# Patient Record
Sex: Male | Born: 1995 | Race: White | Hispanic: No | Marital: Single | Smoking: Never smoker
Health system: Southern US, Community
[De-identification: ages and names within clinical notes are randomized; demographics above are authoritative.]

## PROBLEM LIST (undated history)

## (undated) DIAGNOSIS — K219 Gastro-esophageal reflux disease without esophagitis: Secondary | ICD-10-CM

## (undated) HISTORY — PX: HERNIA REPAIR: SHX51

---
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
## 2017-03-29 ENCOUNTER — Other Ambulatory Visit: Payer: Self-pay | Admitting: Internal Medicine

## 2017-03-29 ENCOUNTER — Ambulatory Visit
Admission: RE | Admit: 2017-03-29 | Discharge: 2017-03-29 | Disposition: A | Discharge status: Home / Self Care | Payer: No Typology Code available for payment source | Source: Ambulatory Visit | Attending: Internal Medicine | Admitting: Internal Medicine

## 2017-03-29 DIAGNOSIS — Z111 Encounter for screening for respiratory tuberculosis: Secondary | ICD-10-CM

## 2018-05-28 ENCOUNTER — Other Ambulatory Visit: Payer: Self-pay

## 2018-05-28 ENCOUNTER — Encounter (HOSPITAL_COMMUNITY): Payer: Self-pay | Admitting: Emergency Medicine

## 2018-05-28 ENCOUNTER — Emergency Department (HOSPITAL_COMMUNITY)
Admission: EM | Admit: 2018-05-28 | Discharge: 2018-05-28 | Disposition: A | Discharge status: Home / Self Care | Payer: BLUE CROSS/BLUE SHIELD | Attending: Emergency Medicine | Admitting: Emergency Medicine

## 2018-05-28 ENCOUNTER — Emergency Department (HOSPITAL_COMMUNITY): Payer: BLUE CROSS/BLUE SHIELD

## 2018-05-28 DIAGNOSIS — R109 Unspecified abdominal pain: Secondary | ICD-10-CM

## 2018-05-28 DIAGNOSIS — R197 Diarrhea, unspecified: Secondary | ICD-10-CM | POA: Diagnosis not present

## 2018-05-28 DIAGNOSIS — R112 Nausea with vomiting, unspecified: Secondary | ICD-10-CM | POA: Insufficient documentation

## 2018-05-28 DIAGNOSIS — R1011 Right upper quadrant pain: Secondary | ICD-10-CM | POA: Insufficient documentation

## 2018-05-28 LAB — CBC WITH DIFFERENTIAL/PLATELET
BASOS ABS: 0.1 10*3/uL (ref 0.0–0.1)
Basophils Relative: 1 %
Eosinophils Absolute: 0.2 10*3/uL (ref 0.0–0.7)
Eosinophils Relative: 2 %
HEMATOCRIT: 46.2 % (ref 39.0–52.0)
HEMOGLOBIN: 15.6 g/dL (ref 13.0–17.0)
LYMPHS PCT: 24 %
Lymphs Abs: 2 10*3/uL (ref 0.7–4.0)
MCH: 29.9 pg (ref 26.0–34.0)
MCHC: 33.8 g/dL (ref 30.0–36.0)
MCV: 88.7 fL (ref 78.0–100.0)
MONO ABS: 0.9 10*3/uL (ref 0.1–1.0)
MONOS PCT: 11 %
NEUTROS ABS: 5 10*3/uL (ref 1.7–7.7)
NEUTROS PCT: 62 %
Platelets: 211 10*3/uL (ref 150–400)
RBC: 5.21 MIL/uL (ref 4.22–5.81)
RDW: 12.6 % (ref 11.5–15.5)
WBC: 8.1 10*3/uL (ref 4.0–10.5)

## 2018-05-28 LAB — COMPREHENSIVE METABOLIC PANEL
ALK PHOS: 43 U/L (ref 38–126)
ALT: 13 U/L (ref 0–44)
AST: 18 U/L (ref 15–41)
Albumin: 4.8 g/dL (ref 3.5–5.0)
Anion gap: 8 (ref 5–15)
BILIRUBIN TOTAL: 1.3 mg/dL — AB (ref 0.3–1.2)
BUN: 9 mg/dL (ref 6–20)
CALCIUM: 9.3 mg/dL (ref 8.9–10.3)
CO2: 26 mmol/L (ref 22–32)
CREATININE: 0.79 mg/dL (ref 0.61–1.24)
Chloride: 105 mmol/L (ref 98–111)
GFR calc Af Amer: >60 mL/min (ref >60)
Glucose, Bld: 90 mg/dL (ref 70–99)
Potassium: 4.3 mmol/L (ref 3.5–5.1)
Sodium: 139 mmol/L (ref 135–145)
TOTAL PROTEIN: 8 g/dL (ref 6.5–8.1)

## 2018-05-28 LAB — URINALYSIS, ROUTINE W REFLEX MICROSCOPIC
Bacteria, UA: NONE SEEN
Bilirubin Urine: NEGATIVE
GLUCOSE, UA: NEGATIVE mg/dL
Hgb urine dipstick: NEGATIVE
Ketones, ur: NEGATIVE mg/dL
Leukocytes, UA: NEGATIVE
Nitrite: NEGATIVE
PH: 6 (ref 5.0–8.0)
Protein, ur: 30 mg/dL — AB
Specific Gravity, Urine: 1.027 (ref 1.005–1.030)

## 2018-05-28 LAB — LIPASE, BLOOD: Lipase: 41 U/L (ref 11–51)

## 2018-05-28 MED ORDER — MORPHINE SULFATE (PF) 2 MG/ML IV SOLN
2.0000 mg | INTRAVENOUS | Status: DC | PRN
  Administered 2018-05-28: 2 mg via INTRAVENOUS
  Filled 2018-05-28: qty 1

## 2018-05-28 MED ORDER — ONDANSETRON 4 MG PO TBDP: 4.0000 mg | ORAL_TABLET | Freq: Three times a day (TID) | ORAL | 0 refills | Status: AC | PRN

## 2018-05-28 MED ORDER — ONDANSETRON HCL 4 MG/2ML IJ SOLN
4.0000 mg | INTRAMUSCULAR | Status: DC | PRN
  Administered 2018-05-28: 4 mg via INTRAVENOUS
  Filled 2018-05-28: qty 2

## 2018-05-28 MED ORDER — FAMOTIDINE 20 MG PO TABS: 20.0000 mg | ORAL_TABLET | Freq: Every day | ORAL | 0 refills | Status: AC

## 2018-05-28 MED ORDER — SODIUM CHLORIDE 0.9 % IV BOLUS
1000.0000 mL | Freq: Once | INTRAVENOUS | Status: AC
  Administered 2018-05-28: 1000 mL via INTRAVENOUS

## 2018-05-28 MED ORDER — IOPAMIDOL (ISOVUE-300) INJECTION 61%
100.0000 mL | Freq: Once | INTRAVENOUS | Status: AC | PRN
  Administered 2018-05-28: 100 mL via INTRAVENOUS

## 2018-05-28 NOTE — ED Triage Notes (Signed)
Patient reports loss of appetite x1 week and right upper abd/flank  pain that started this morning. Per patient nausea, vomiting, and diarrhea. Unsure of any fevers and denies any urinary symptoms.

## 2018-05-28 NOTE — ED Provider Notes (Signed)
[facility] EMERGENCY DEPARTMENT Provider Note   CSN: [id number] Arrival date & time: 05/28/18  1238     History   Chief Complaint Chief Complaint  Patient presents with  . Abdominal Pain    HPI Dan Hopkins is a 22 y.o. male.  HPI Pt was seen at 1445.  Per pt, c/o gradual onset and persistence of waxing and waning RUQ abd "pain" for the past several days.  Has been associated with decreased appetite, nausea, dry heaves, and a few episodes of diarrhea.  Describes the abd pain as "sharp" and "comes in waves."  States his symptoms began "after I was outside and got hot." Denies fevers, no back pain, no rash, no CP/palpitations, no cough/SOB, no black or blood in stools or emesis.      Past Medical History:  Diagnosis Date  . GERD (gastroesophageal reflux disease)     There are no active problems to display for this patient.   Past Surgical History:  Procedure Laterality Date  . HERNIA REPAIR          Home Medications    Prior to Admission medications   Not on File    Family History History reviewed. No pertinent family history.  Social History Social History   Tobacco Use  . Smoking status: Never Smoker  . Smokeless tobacco: Never Used  Substance Use Topics  . Alcohol use: No  . Drug use: Yes    Types: Marijuana     Allergies   Patient has no known allergies.   Review of Systems Review of Systems ROS: Statement: All systems negative except as marked or noted in the HPI; Constitutional: Negative for fever and chills. ; ; Eyes: Negative for eye pain, redness and discharge. ; ; ENMT: Negative for ear pain, hoarseness, nasal congestion, sinus pressure and sore throat. ; ; Cardiovascular: Negative for chest pain, palpitations, diaphoresis, dyspnea and peripheral edema. ; ; Respiratory: Negative for cough, wheezing and stridor. ; ; Gastrointestinal: +N/V/D, abd pain. Negative for blood in stool, hematemesis, jaundice and rectal bleeding. . ; ;  Genitourinary: Negative for dysuria, flank pain and hematuria. ; ; Musculoskeletal: Negative for back pain and neck pain. Negative for swelling and trauma.; ; Skin: Negative for pruritus, rash, abrasions, blisters, bruising and skin lesion.; ; Neuro: Negative for headache, lightheadedness and neck stiffness. Negative for weakness, altered level of consciousness, altered mental status, extremity weakness, paresthesias, involuntary movement, seizure and syncope.       Physical Exam Updated Vital Signs BP 110/61 (BP Location: Left Arm)   Pulse (!) 48   Temp 98.5 F (36.9 C) (Oral)   Resp 16   Ht 6\' 1"  (1.854 m)   Wt 72.1 kg (159 lb)   SpO2 100%   BMI 20.98 kg/m   Physical Exam 1450: Physical examination:  Nursing notes reviewed; Vital signs and O2 SAT reviewed;  Constitutional: Well developed, Well nourished, Well hydrated, In no acute distress; Head:  Normocephalic, atraumatic; Eyes: EOMI, PERRL, No scleral icterus; ENMT: Mouth and pharynx normal, Mucous membranes moist; Neck: Supple, Full range of motion, No lymphadenopathy; Cardiovascular: Regular rate and rhythm, No gallop; Respiratory: Breath sounds clear & equal bilaterally, No wheezes.  Speaking full sentences with ease, Normal respiratory effort/excursion; Chest: Nontender, Movement normal; Abdomen: Soft, +RUQ and mid-epigastric areas tender to palp. No rebound or guarding. Nondistended, Normal bowel sounds; Genitourinary: No CVA tenderness; Extremities: Peripheral pulses normal, No tenderness, No edema, No calf edema or asymmetry.; Neuro: AA&Ox3, Major CN grossly intact.  Speech clear. No gross focal motor or sensory deficits in extremities.; Skin: Color normal, Warm, Dry.   ED Treatments / Results  Labs (all labs ordered are listed, but only abnormal results are displayed)   EKG None  Radiology   Procedures Procedures (including critical care time)  Medications Ordered in ED Medications  sodium chloride 0.9 % bolus 1,000  mL (1,000 mLs Intravenous New Bag/Given 05/28/18 1517)  ondansetron (ZOFRAN) injection 4 mg (4 mg Intravenous Given 05/28/18 1513)  morphine 2 MG/ML injection 2 mg (2 mg Intravenous Given 05/28/18 1513)  iopamidol (ISOVUE-300) 61 % injection 100 mL (100 mLs Intravenous Contrast Given 05/28/18 1521)     Initial Impression / Assessment and Plan / ED Course  I have reviewed the triage vital signs and the nursing notes.  Pertinent labs & imaging results that were available during my care of the patient were reviewed by me and considered in my medical decision making (see chart for details).  MDM Reviewed: previous chart, nursing note and vitals Reviewed previous: labs Interpretation: labs, x-ray and CT scan   Results for orders placed or performed during the hospital encounter of 05/28/18  Lipase, blood  Result Value Ref Range   Lipase 41 11 - 51 U/L  Comprehensive metabolic panel  Result Value Ref Range   Sodium 139 135 - 145 mmol/L   Potassium 4.3 3.5 - 5.1 mmol/L   Chloride 105 98 - 111 mmol/L   CO2 26 22 - 32 mmol/L   Glucose, Bld 90 70 - 99 mg/dL   BUN 9 6 - 20 mg/dL   Creatinine, Ser 4.09 0.61 - 1.24 mg/dL   Calcium 9.3 8.9 - 81.1 mg/dL   Total Protein 8.0 6.5 - 8.1 g/dL   Albumin 4.8 3.5 - 5.0 g/dL   AST 18 15 - 41 U/L   ALT 13 0 - 44 U/L   Alkaline Phosphatase 43 38 - 126 U/L   Total Bilirubin 1.3 (H) 0.3 - 1.2 mg/dL   GFR calc non Af Amer >60 >60 mL/min   GFR calc Af Amer >60 >60 mL/min   Anion gap 8 5 - 15  Urinalysis, Routine w reflex microscopic  Result Value Ref Range   Color, Urine YELLOW YELLOW   APPearance CLEAR CLEAR   Specific Gravity, Urine 1.027 1.005 - 1.030   pH 6.0 5.0 - 8.0   Glucose, UA NEGATIVE NEGATIVE mg/dL   Hgb urine dipstick NEGATIVE NEGATIVE   Bilirubin Urine NEGATIVE NEGATIVE   Ketones, ur NEGATIVE NEGATIVE mg/dL   Protein, ur 30 (A) NEGATIVE mg/dL   Nitrite NEGATIVE NEGATIVE   Leukocytes, UA NEGATIVE NEGATIVE   RBC / HPF 0-5 0 - 5  RBC/hpf   WBC, UA 0-5 0 - 5 WBC/hpf   Bacteria, UA NONE SEEN NONE SEEN   Squamous Epithelial / LPF 0-5 0 - 5   Mucus PRESENT   CBC with Differential/Platelet  Result Value Ref Range   WBC 8.1 4.0 - 10.5 K/uL   RBC 5.21 4.22 - 5.81 MIL/uL   Hemoglobin 15.6 13.0 - 17.0 g/dL   HCT 91.4 78.2 - 95.6 %   MCV 88.7 78.0 - 100.0 fL   MCH 29.9 26.0 - 34.0 pg   MCHC 33.8 30.0 - 36.0 g/dL   RDW 21.3 08.6 - 57.8 %   Platelets 211 150 - 400 K/uL   Neutrophils Relative % 62 %   Neutro Abs 5.0 1.7 - 7.7 K/uL   Lymphocytes Relative 24 %  Lymphs Abs 2.0 0.7 - 4.0 K/uL   Monocytes Relative 11 %   Monocytes Absolute 0.9 0.1 - 1.0 K/uL   Eosinophils Relative 2 %   Eosinophils Absolute 0.2 0.0 - 0.7 K/uL   Basophils Relative 1 %   Basophils Absolute 0.1 0.0 - 0.1 K/uL   Dg Chest 2 View Result Date: 05/28/2018 CLINICAL DATA:  Patient with loss of appetite.  Flank pain. EXAM: CHEST - 2 VIEW COMPARISON:  Chest radiograph 03/29/2017 FINDINGS: Stable cardiac and mediastinal contours. No consolidative pulmonary opacities. No pleural effusion or pneumothorax. Regional skeleton is unremarkable. IMPRESSION: No acute cardiopulmonary process. Electronically Signed   By: Annia Beltrew  Davis M.D.   On: 05/28/2018 15:54   Ct Abdomen Pelvis W Contrast Result Date: 05/28/2018 CLINICAL DATA:  Acute abdominal pain.  Right-sided pain EXAM: CT ABDOMEN AND PELVIS WITH CONTRAST TECHNIQUE: Multidetector CT imaging of the abdomen and pelvis was performed using the standard protocol following bolus administration of intravenous contrast. CONTRAST:  100mL ISOVUE-300 IOPAMIDOL (ISOVUE-300) INJECTION 61% COMPARISON:  01/24/2016 FINDINGS: Lower chest:  No contributory findings. Hepatobiliary: No focal liver abnormality.No evidence of biliary obstruction or stone. Pancreas: Unremarkable. Spleen: Unremarkable. Adrenals/Urinary Tract: Negative adrenals. No hydronephrosis or stone. Narrowing of the left renal vein as it crosses under the SMA,  incidental in this setting. Unremarkable bladder. Stomach/Bowel:  No obstruction. No appendicitis. Vascular/Lymphatic: No acute vascular abnormality. No mass or adenopathy. Reproductive:No pathologic findings. Other: No ascites or pneumoperitoneum. Musculoskeletal: No acute abnormalities. IMPRESSION: No explanation for abdominal pain. Electronically Signed   By: Marnee SpringJonathon  Watts M.D.   On: 05/28/2018 15:56    1645:  Epic chart reviewed: pt with chronically elevated Tbili. Feels better after meds and wants to go home now. Pt has tol PO well while in the ED without N/V.  No stooling while in the ED. VSS. Tx symptomatically at this time. Dx and testing d/w pt and family.  Questions answered.  Verb understanding, agreeable to d/c home with outpt f/u.   Final Clinical Impressions(s) / ED Diagnoses   Final diagnoses:  None    ED Discharge Orders    None       Samuel JesterMcManus, Tajha Sammarco, DO 06/01/18 1435

## 2018-05-28 NOTE — ED Notes (Signed)
Pt reports going to [employer] Fri and got hot  awakened last pm with R rib pain   Reports N

## 2018-05-28 NOTE — ED Notes (Signed)
To CT

## 2018-05-28 NOTE — Discharge Instructions (Signed)
Take the prescriptions as directed.  Increase your fluid intake (ie:  Gatoraide) for the next few days, as discussed.  Eat a bland diet and advance to your regular diet slowly as you can tolerate it.   Avoid full strength juices, as well as milk and milk products until your diarrhea has resolved. Eat a bland diet, avoiding greasy, fatty, fried foods, as well as spicy and acidic foods or beverages.  Avoid eating within 2 to 3 hours before going to bed or laying down.  Also avoid teas, colas, coffee, chocolate, pepermint and spearment. Call your regular medical doctor tomorrow to schedule a follow up appointment this week.  Return to the Emergency Department immediately if worsening.

## 2019-06-11 ENCOUNTER — Other Ambulatory Visit: Payer: Self-pay

## 2019-06-11 ENCOUNTER — Other Ambulatory Visit: Payer: BLUE CROSS/BLUE SHIELD

## 2019-06-11 DIAGNOSIS — Z20822 Contact with and (suspected) exposure to covid-19: Secondary | ICD-10-CM

## 2019-06-15 LAB — NOVEL CORONAVIRUS, NAA: SARS-CoV-2, NAA: NOT DETECTED

## 2019-09-11 IMAGING — CT CT ABD-PELV W/ CM
2 of 4 series · 17 of 46 positions shown, 19 images · IV contrast (Isovue)
Comparison: 01/24/2016

CLINICAL DATA: Acute abdominal pain.  Right-sided pain

EXAM:
CT ABDOMEN AND PELVIS WITH CONTRAST
TECHNIQUE: Multidetector CT imaging of the abdomen and pelvis was performed
using the standard protocol following bolus administration of
intravenous contrast.
CONTRAST:  100mL [id number] IOPAMIDOL ([id number]) INJECTION 61%

[Series 2: axial st · axial · 0.71mm/px · z∈[+1425,+1830]mm · 14 of 89 slices shown, 16 images]
[im 4/89  soft-tissue]
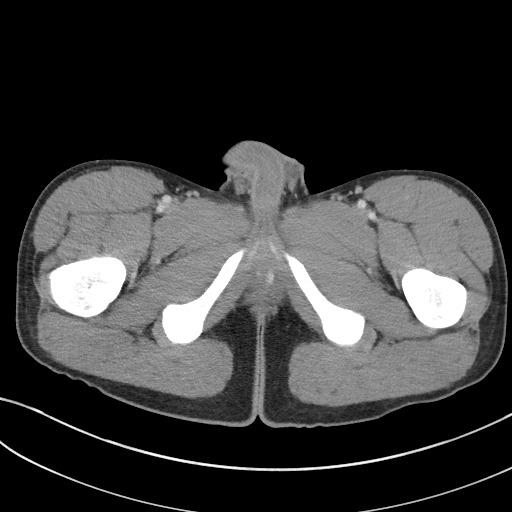
[im 4/89  bone]
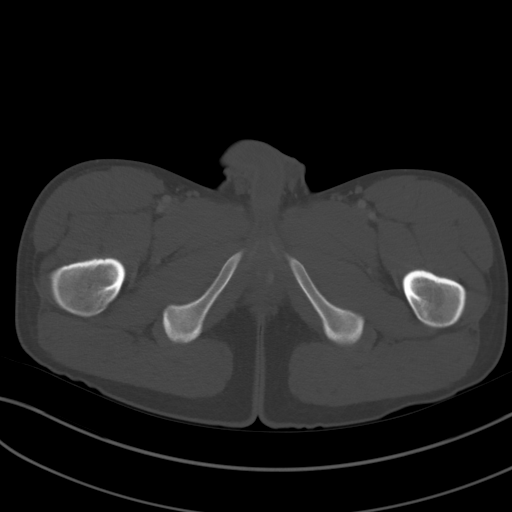
[im 12/89  soft-tissue]
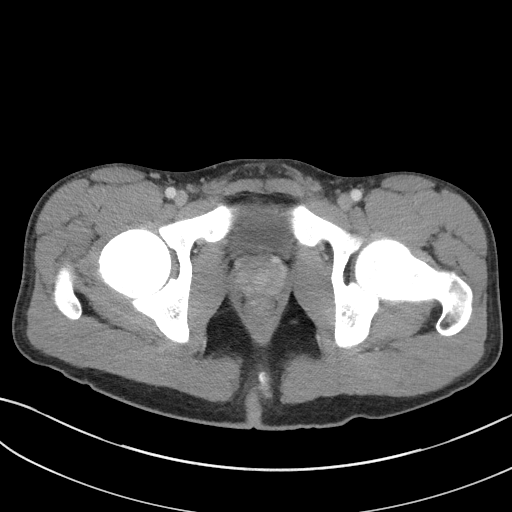
[im 19/89  soft-tissue]
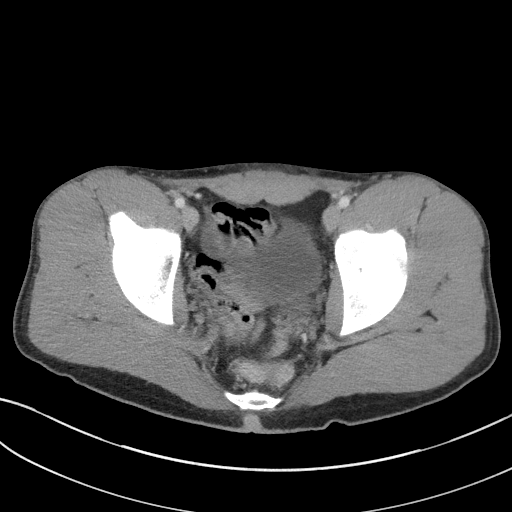
[im 23/89  soft-tissue]
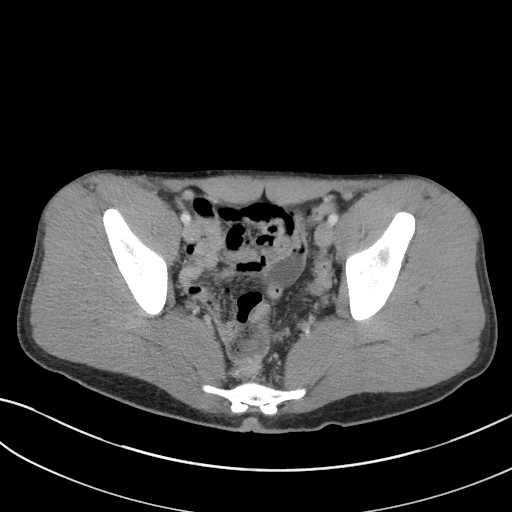
[im 30/89  soft-tissue]
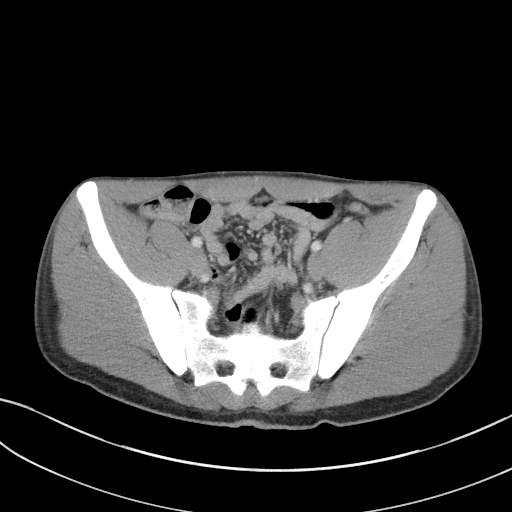
[im 37/89  soft-tissue]
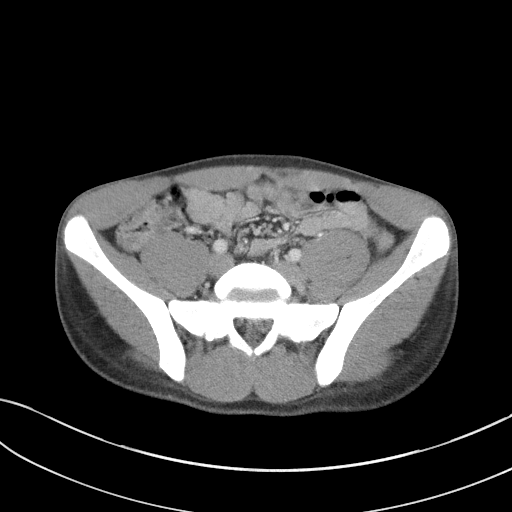
[im 41/89  soft-tissue]
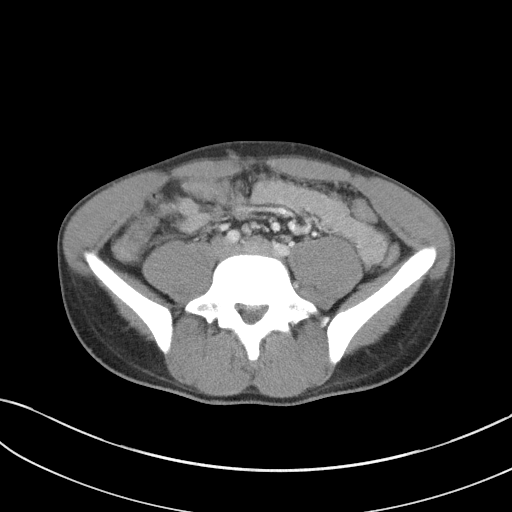
[im 48/89  soft-tissue]
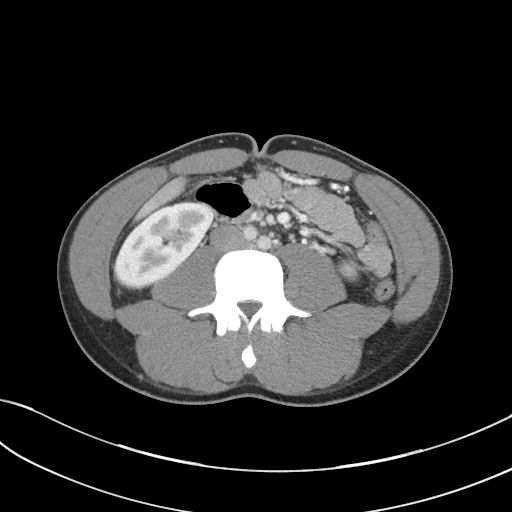
[im 52/89  soft-tissue]
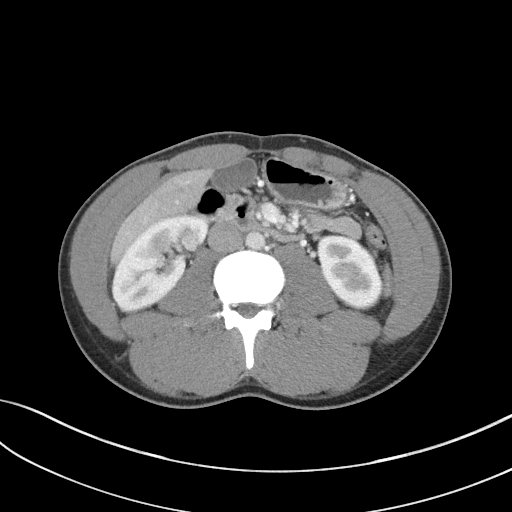
[im 52/89  bone]
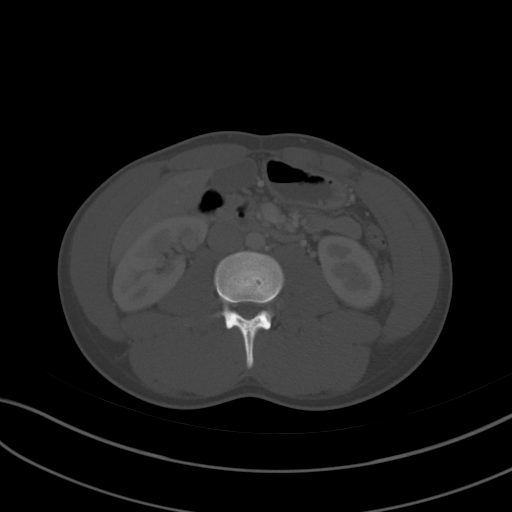
[im 59/89  soft-tissue]
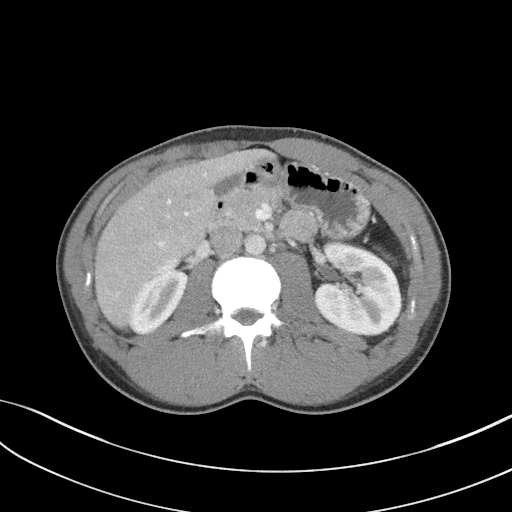
[im 67/89  soft-tissue]
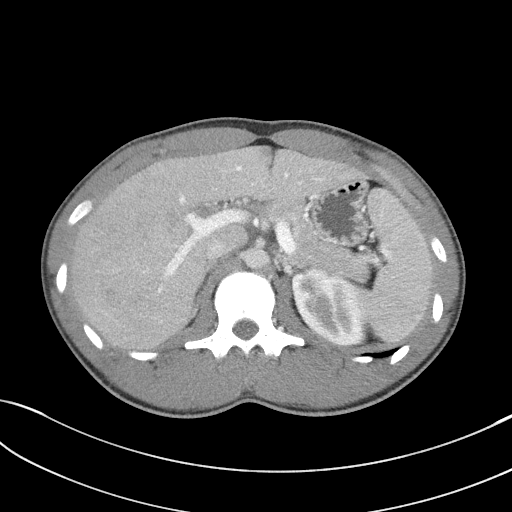
[im 70/89  soft-tissue]
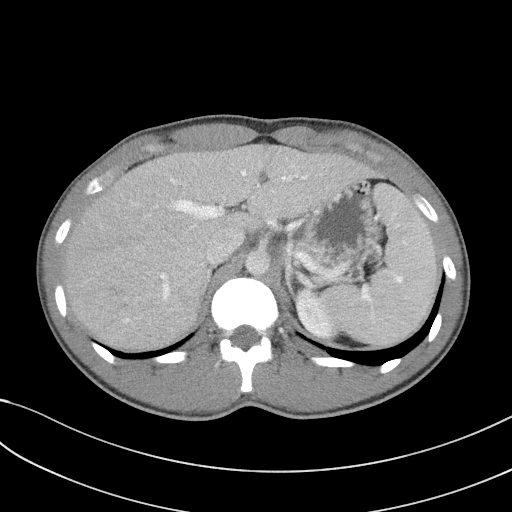
[im 78/89  soft-tissue]
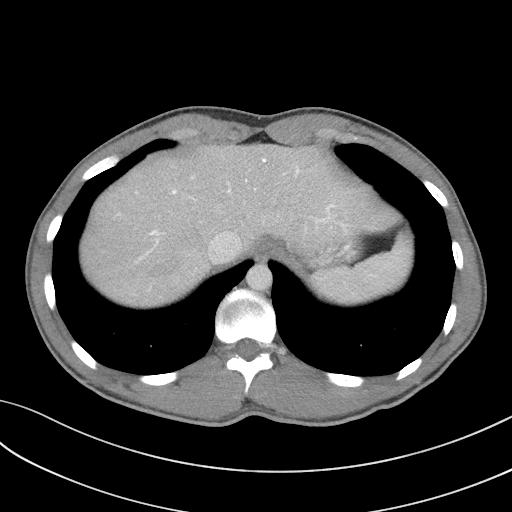
[im 85/89  soft-tissue]
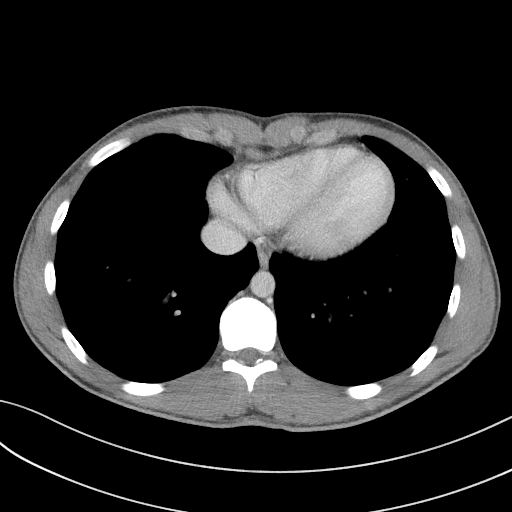

[Series 5: coronal st · coronal · 0.72mm/px · 3 of 72 slices shown]
[im 24/72  soft-tissue]
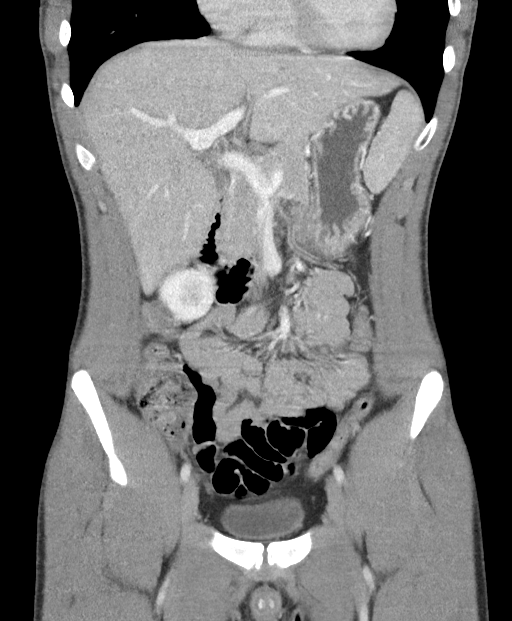
[im 32/72  soft-tissue]
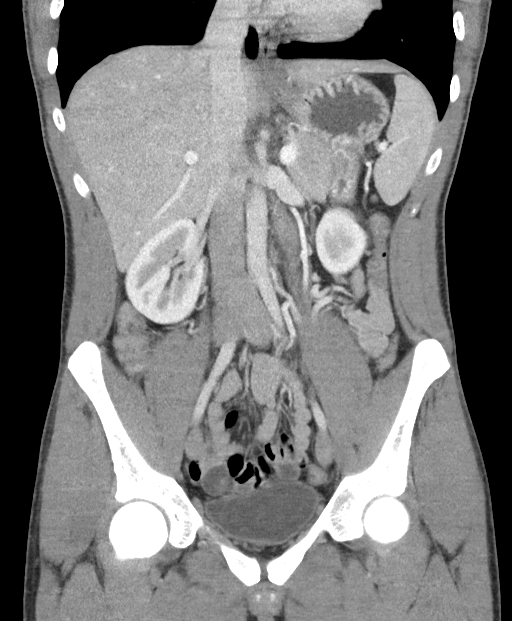
[im 40/72  soft-tissue]
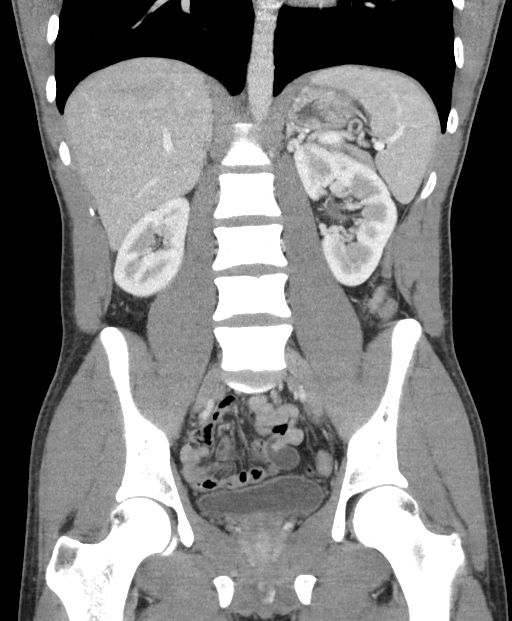

[17 of 46 positions shown; findings below may reference images not displayed]

FINDINGS: Lower chest:  No contributory findings.

Hepatobiliary: No focal liver abnormality.No evidence of biliary
obstruction or stone.

Pancreas: Unremarkable.

Spleen: Unremarkable.

Adrenals/Urinary Tract: Negative adrenals. No hydronephrosis or
stone. Narrowing of the left renal vein as it crosses under the SMA,
incidental in this setting. Unremarkable bladder.

Stomach/Bowel:  No obstruction. No appendicitis.

Vascular/Lymphatic: No acute vascular abnormality. No mass or
adenopathy.

Reproductive:No pathologic findings.

Other: No ascites or pneumoperitoneum.

Musculoskeletal: No acute abnormalities.
IMPRESSION: No explanation for abdominal pain.

## 2019-09-21 ENCOUNTER — Other Ambulatory Visit: Payer: Self-pay

## 2019-09-21 DIAGNOSIS — Z20822 Contact with and (suspected) exposure to covid-19: Secondary | ICD-10-CM

## 2019-09-23 LAB — NOVEL CORONAVIRUS, NAA: SARS-CoV-2, NAA: NOT DETECTED

## 2019-09-24 ENCOUNTER — Telehealth: Payer: Self-pay | Admitting: *Deleted

## 2019-09-24 NOTE — Telephone Encounter (Signed)
See results note-reviewed negative [id number] results with patient's mother.
